# Patient Record
Sex: Female | Born: 1970 | Race: White | Hispanic: No | Marital: Married | Smoking: Former smoker
Health system: Southern US, Community
[De-identification: ages and names within clinical notes are randomized; demographics above are authoritative.]

## PROBLEM LIST (undated history)

## (undated) DIAGNOSIS — I8390 Asymptomatic varicose veins of unspecified lower extremity: Secondary | ICD-10-CM

## (undated) DIAGNOSIS — K219 Gastro-esophageal reflux disease without esophagitis: Secondary | ICD-10-CM

## (undated) HISTORY — DX: Asymptomatic varicose veins of unspecified lower extremity: I83.90

## (undated) HISTORY — PX: DILATION AND CURETTAGE OF UTERUS: SHX78

## (undated) HISTORY — PX: FOREIGN BODY REMOVAL: SHX962

## (undated) HISTORY — PX: BREAST ENHANCEMENT SURGERY: SHX7

---

## 2010-02-16 ENCOUNTER — Ambulatory Visit (HOSPITAL_COMMUNITY)
Admission: RE | Admit: 2010-02-16 | Discharge: 2010-02-16 | Payer: Self-pay | Source: Home / Self Care | Attending: Obstetrics & Gynecology | Admitting: Obstetrics & Gynecology

## 2010-02-28 LAB — SURGICAL PCR SCREEN
MRSA, PCR: NEGATIVE
Staphylococcus aureus: NEGATIVE

## 2010-02-28 LAB — CBC
HCT: 39.3 % (ref 36.0–46.0)
Hemoglobin: 13.3 g/dL (ref 12.0–15.0)
MCH: 30.9 pg (ref 26.0–34.0)
MCHC: 33.8 g/dL (ref 30.0–36.0)
MCV: 91.2 fL (ref 78.0–100.0)
Platelets: 135 10*3/uL — ABNORMAL LOW (ref 150–400)
RBC: 4.31 MIL/uL (ref 3.87–5.11)
RDW: 12.6 % (ref 11.5–15.5)
WBC: 9.6 10*3/uL (ref 4.0–10.5)

## 2010-02-28 LAB — RPR: RPR Ser Ql: NONREACTIVE

## 2010-03-02 ENCOUNTER — Encounter (INDEPENDENT_AMBULATORY_CARE_PROVIDER_SITE_OTHER): Payer: Self-pay | Admitting: Obstetrics & Gynecology

## 2010-03-02 ENCOUNTER — Inpatient Hospital Stay (HOSPITAL_COMMUNITY)
Admission: RE | Admit: 2010-03-02 | Discharge: 2010-03-04 | Payer: Self-pay | Source: Home / Self Care | Attending: Obstetrics & Gynecology | Admitting: Obstetrics & Gynecology

## 2010-03-06 LAB — CBC
HCT: 31.7 % — ABNORMAL LOW (ref 36.0–46.0)
Hemoglobin: 10.5 g/dL — ABNORMAL LOW (ref 12.0–15.0)
MCH: 30.1 pg (ref 26.0–34.0)
MCHC: 33.1 g/dL (ref 30.0–36.0)
MCV: 90.8 fL (ref 78.0–100.0)
Platelets: 110 10*3/uL — ABNORMAL LOW (ref 150–400)
RBC: 3.49 MIL/uL — ABNORMAL LOW (ref 3.87–5.11)
RDW: 12.6 % (ref 11.5–15.5)
WBC: 9.7 10*3/uL (ref 4.0–10.5)

## 2010-03-12 NOTE — Discharge Summary (Signed)
  NAMECAYLAH, [name]                 ACCOUNT NO.:  [id number]  MEDICAL RECORD NO.:  [id number]          PATIENT TYPE:  INP  LOCATION:  9105                          FACILITY:  [facility]  PHYSICIAN:  Darryl Nestle, MD     DATE OF BIRTH:  08/27/1970  DATE OF ADMISSION:  03/02/2010 DATE OF DISCHARGE:  03/04/2010                              DISCHARGE SUMMARY   ADMISSION DIAGNOSES: 1. A 38 weeks. 2. Oligohydramnios. 3. Lagging fetal growth. 4. Elective cesarean section.  DISCHARGE DIAGNOSES: 1. A 38 weeks. 2. Oligohydramnios. 3. Lagging fetal growth. 4. Elective cesarean section. 5. Status post surgery.  HOSPITAL COURSE:  The patient is a 40 year old G3, P1-0-1-1, who had oligohydramnios since 35 weeks and she was undergoing antenatal testing. This was an IVF ICSI pregnancy which had been uncomplicated thus far. The fetal growth was lagging.  The patient had MFM consult and the recommendation was to proceed with delivery if the largest amniotic fluid pocket was less than 2 cm.  On March 01, 2010, the ultrasound showed amniotic fluid index of 5.8, but the largest pocket was less than 2 cm.  The patient was hence recommended delivery on March 10, 2010, when she was 38 weeks.  The patient had prior vaginal delivery 16 years ago but she desired to have elective C-section for this pregnancy and refused vaginal birth.  Risks and complications of surgery were discussed with the patient including immediate and long-term complications and also possible blood transfusion and she voiced understanding.  Prenatal care was at [facility] with Dr. Juliene Pina.  Prenatal labs were within normal limits.  She was Rh positive, rubella immune, and GBS negative.  Rest of the labs were normal including normal 1-hour Glucola.  On March 02, 2010, the patient was admitted for elective cesarean section which was performed under spinal anesthesia and without any complications.  Postoperatively, the  patient continued to do well. Vital signs were stable.  Postoperative laboratory evaluations were within normal limits.  Her pain was well controlled.  She was ambulating, voiding, and tolerating general diet.  She desired to get discharged home on postop day #2.  Her baby girl stayed with the mother after delivery and was doing well.  For the details of surgery and baby's record, please refer to the operative note.  Incision was clean, dry, and intact.  She has subcuticular sutures with Steri-Strips.  Postop instructions were reviewed including warning signs and complications.  Followup will be in 6 weeks in the office with Dr. Juliene Pina and postoperative pain management was reviewed.  Prescriptions given for Percocet, ibuprofen, and Colace.  The patient will continue prenatal vitamins and additional calcium.  DISCHARGE DISPOSITION:  Home with family.  DISCHARGE CONDITION:  Stable.     Darryl Nestle, MD     VM/MEDQ  D:  03/09/2010  T:  03/09/2010  Job:  [id number]  Electronically Signed by Susa Day MODY  on 03/12/2010 06:28:31 PM

## 2010-03-12 NOTE — Op Note (Signed)
[name], [name]                 ACCOUNT NO.:  [id number]  MEDICAL RECORD NO.:  [id number]          PATIENT TYPE:  INP  LOCATION:  9105                          FACILITY:  [facility]  PHYSICIAN:  Darryl Nestle, MD     DATE OF BIRTH:  April 06, 1970  DATE OF PROCEDURE:  03/02/2010 DATE OF DISCHARGE:                              OPERATIVE REPORT   PREOPERATIVE DIAGNOSES: 1. 38 weeks' in vitro fertilization pregnancy. 2. Persistent oligohydramnios. 3. Lagging fetal growth. 4. Elective cesarean section desired.  POSTOPERATIVE DIAGNOSES: 1. 38 weeks' in vitro fertilization pregnancy. 2. Persistent oligohydramnios. 3. Lagging fetal growth. 4. Elective cesarean section desired.  PROCEDURE:  Primary low transverse cesarean section.  SURGEON:  Darryl Nestle, MD  ASSISTANT:  Marlinda Mike, CNM  ANESTHESIA:  Spinal.  FINDINGS:  Minimal amniotic fluid.  Baby girl delivered at 11:44 a.m. Apgars 8 and 9, weight 5 pounds 14 ounces.  Placenta normal three-vessel cord, normal ovaries.  SPECIMEN:  Placenta sent to pathology.  ESTIMATED BLOOD LOSS:  800 mL.  INTRAVENOUS FLUIDS:  2550 mL LR.  URINE OUTPUT:  100 mL, clear.  COMPLICATIONS:  None.  CONDITION:  Stable.  PATIENT DISPOSITION:  PACU.  INDICATIONS:  The patient is a 40 year old G3, P1-0-1-1 at 44 weeks, who conceived with IVF (ICSI).  She had uncomplicated pregnancy course until 35 weeks when oligohydramnios was diagnosed.  The patient's amniotic fluid fluctuated between 4.8 to 6 cm.  Thereafter, she had antenatal testing and ultrasound showed lagging fetal growth.  She had MFM consult and she was recommended delivery after 37 weeks if the largest amniotic fluid was less than 2 cm.  The patient had ultrasound performed on March 01, 2010, that showed amniotic fluid at 5.8, but the largest pocket was less than 2 cm.  The patient desired to proceed with cesarean delivery rather than vaginal trial of labor, even though her  prior delivery was vaginal, but that was about 16 years ago and she did not want to try vaginal delivery.  Risks and complications of surgery including infection, bleeding, and damage to internal organs, and delayed complications with adhesions and other complications were reviewed.  Informed written consent was obtained.  DESCRIPTION OF PROCEDURE:  The patient was brought to the operating room with IV running.  She had normal fetal heart tones, normal vital signs. She was given a spinal anesthesia and Ancef 1 g IV.  She was then made supine and was prepped and draped in normal sterile fashion after Foley catheter was placed.  Incision was made in Pfannenstiel fashion and carried down to the underlying fascia, which was incised in the midline, extended laterally curving upward.  Fascial edges were dissected off from underlying muscles which were then separated in the midline and the peritoneum was entered by a blunt and sharp dissection and extended superiorly and inferiorly.  There were no complications with the peritoneal entry.  Bladder blade was placed.  Lower segment was palpated.  The fetal head was floating.  The uterovesical fold of peritoneum was incised and extended laterally.  Bladder flap created. Bladder blade advanced.  A low transverse  cesarean incision was made, which was extended laterally curving upward with bandage scissors. Minimal amniotic fluid was noted.  There was no evidence of meconium. The head delivery was performed with gentle flexion and fundal pressure, and after head delivery, rest of the baby delivered without difficulty. The bulb suction was done of nose and mouth.  Cord was clamped and cut and baby was shown to the parents and given to the awaiting neonatologist.  Cord blood was collected.  Placenta was separated manually and removed.  Inside of the uterus was cleaned with clean lap x2.  The uterine incision edges were grasped with ring  forceps. Hysterotomy was closed in two layers using 0 Monocryl in two layers. The first layer was continuous interlocking, second one was imbricating the first.  Hemostasis was excellent.  Irrigation was performed of the peritoneum and the incision.  Bilateral ovaries and tubes appeared normal.  At this point, the parietal peritoneal edges were grasped with Kelly clamps and peritoneum was closed using 2-0 Vicryl.  Muscle at the level of pyramidalis was approximated using 2-0 Vicryl and the fascia was closed using 0 Vicryl starting from two angles meeting to the right of the patient's midline.  The subcutaneous tissue was evaluated.  There was no active bleeding and the skin was closed in subcuticular fashion using 3-0 Vicryl.  Steri-Strips applied.  Pressure dressing was given. The patient was brought out to the recovery room in stable condition. Baby was brought to the nursery while the mother was finishing up with the surgery and the patient's husband also went to the nursery.  Mother and baby are stable.     Darryl Nestle, MD     VM/MEDQ  D:  03/02/2010  T:  03/03/2010  Job:  [id number]  Electronically Signed by Susa Day Elyon Zoll  on 03/12/2010 81:82:99 PM

## 2010-03-14 ENCOUNTER — Inpatient Hospital Stay (HOSPITAL_COMMUNITY): Admission: AD | Admit: 2010-03-14 | Payer: Self-pay | Admitting: Obstetrics and Gynecology

## 2010-10-04 ENCOUNTER — Other Ambulatory Visit: Payer: Self-pay | Admitting: Dermatology

## 2011-07-25 ENCOUNTER — Other Ambulatory Visit: Payer: Self-pay

## 2011-12-06 LAB — OB RESULTS CONSOLE RUBELLA ANTIBODY, IGM: Rubella: IMMUNE

## 2011-12-06 LAB — OB RESULTS CONSOLE ABO/RH: RH Type: POSITIVE

## 2011-12-06 LAB — OB RESULTS CONSOLE ANTIBODY SCREEN: Antibody Screen: NEGATIVE

## 2011-12-06 LAB — OB RESULTS CONSOLE RPR: RPR: NONREACTIVE

## 2011-12-06 LAB — OB RESULTS CONSOLE HIV ANTIBODY (ROUTINE TESTING): HIV: NONREACTIVE

## 2011-12-06 LAB — OB RESULTS CONSOLE HEPATITIS B SURFACE ANTIGEN: Hepatitis B Surface Ag: NEGATIVE

## 2012-04-21 ENCOUNTER — Other Ambulatory Visit: Payer: Self-pay | Admitting: Obstetrics & Gynecology

## 2012-06-01 ENCOUNTER — Encounter (HOSPITAL_COMMUNITY): Payer: Self-pay | Admitting: Pharmacy Technician

## 2012-06-09 ENCOUNTER — Encounter (HOSPITAL_COMMUNITY): Payer: Self-pay

## 2012-06-10 ENCOUNTER — Encounter (HOSPITAL_COMMUNITY): Payer: Self-pay

## 2012-06-10 ENCOUNTER — Encounter (HOSPITAL_COMMUNITY)
Admission: RE | Admit: 2012-06-10 | Discharge: 2012-06-10 | Disposition: A | Discharge status: Home / Self Care | Payer: PRIVATE HEALTH INSURANCE | Source: Ambulatory Visit | Attending: Obstetrics & Gynecology | Admitting: Obstetrics & Gynecology

## 2012-06-10 HISTORY — DX: Gastro-esophageal reflux disease without esophagitis: K21.9

## 2012-06-10 LAB — CBC
HCT: 39.2 % (ref 36.0–46.0)
Hemoglobin: 13.3 g/dL (ref 12.0–15.0)
MCH: 30.6 pg (ref 26.0–34.0)
MCHC: 33.9 g/dL (ref 30.0–36.0)
MCV: 90.1 fL (ref 78.0–100.0)
Platelets: 130 10*3/uL — ABNORMAL LOW (ref 150–400)
RBC: 4.35 MIL/uL (ref 3.87–5.11)
RDW: 13.4 % (ref 11.5–15.5)
WBC: 11.3 10*3/uL — ABNORMAL HIGH (ref 4.0–10.5)

## 2012-06-10 LAB — RPR: RPR Ser Ql: NONREACTIVE

## 2012-06-10 LAB — TYPE AND SCREEN
ABO/RH(D): A POS
Antibody Screen: NEGATIVE

## 2012-06-10 LAB — ABO/RH: ABO/RH(D): A POS

## 2012-06-10 NOTE — Patient Instructions (Addendum)
20 [name]  06/10/2012   Your procedure is scheduled on:  06/12/12  Enter through the Main Entrance of [facility] at 6 AM.  Pick up the phone at the desk and dial [phone].   Call this number if you have problems the morning of surgery: [phone]   Remember:   Do not eat food:After Midnight.  Do not drink clear liquids: After Midnight.  Take these medicines the morning of surgery with A SIP OF WATER: Pepcid   Do not wear jewelry, make-up or nail polish.  Do not wear lotions, powders, or perfumes. You may wear deodorant.  Do not shave 48 hours prior to surgery.  Do not bring valuables to the hospital.  Contacts, dentures or bridgework may not be worn into surgery.  Leave suitcase in the car. After surgery it may be brought to your room.  For patients admitted to the hospital, checkout time is 11:00 AM the day of discharge.   Patients discharged the day of surgery will not be allowed to drive home.  Name and phone number of your driver: NA  Special Instructions: Shower using CHG 2 nights before surgery and the night before surgery.  If you shower the day of surgery use CHG.  Use special wash - you have one bottle of CHG for all showers.  You should use approximately 1/3 of the bottle for each shower.   Please read over the following fact sheets that you were given: Surgical Site Infection Prevention

## 2012-06-11 ENCOUNTER — Encounter (HOSPITAL_COMMUNITY): Payer: Self-pay | Admitting: Anesthesiology

## 2012-06-11 NOTE — Anesthesia Preprocedure Evaluation (Addendum)
Anesthesia Evaluation  Patient identified by MRN, date of birth, ID band Patient awake    Reviewed: Allergy & Precautions, H&P , NPO status , Patient's Chart, lab work & pertinent test results, Unable to perform ROS - Chart review only  Airway Mallampati: II TM Distance: >3 FB Neck ROM: Full    Dental no notable dental hx. (+) Teeth Intact   Pulmonary neg pulmonary ROS,    Pulmonary exam normal       Cardiovascular negative cardio ROS  Rhythm:Regular Rate:Normal     Neuro/Psych Depression negative neurological ROS     GI/Hepatic Neg liver ROS, GERD-  Medicated and Controlled,  Endo/Other  negative endocrine ROSObesity  Renal/GU negative Renal ROS  negative genitourinary   Musculoskeletal negative musculoskeletal ROS (+)   Abdominal   Peds  Hematology negative hematology ROS (+)   Anesthesia Other Findings   Reproductive/Obstetrics Twin Gestation Previous C/Section Desires Sterilization AMA                         Anesthesia Physical Anesthesia Plan  ASA: II  Anesthesia Plan: Spinal   Post-op Pain Management:    Induction:   Airway Management Planned: Natural Airway  Additional Equipment:   Intra-op Plan:   Post-operative Plan:   Informed Consent: I have reviewed the patients History and Physical, chart, labs and discussed the procedure including the risks, benefits and alternatives for the proposed anesthesia with the patient or authorized representative who has indicated his/her understanding and acceptance.   Dental advisory given  Plan Discussed with: Anesthesiologist, Surgeon and CRNA  Anesthesia Plan Comments:         Anesthesia Quick Evaluation

## 2012-06-12 ENCOUNTER — Inpatient Hospital Stay (HOSPITAL_COMMUNITY): Payer: PRIVATE HEALTH INSURANCE | Admitting: Anesthesiology

## 2012-06-12 ENCOUNTER — Encounter (HOSPITAL_COMMUNITY)
Admission: RE | Disposition: A | Discharge status: Home / Self Care | Payer: Self-pay | Source: Ambulatory Visit | Attending: Obstetrics & Gynecology

## 2012-06-12 ENCOUNTER — Inpatient Hospital Stay (HOSPITAL_COMMUNITY)
Admission: RE | Admit: 2012-06-12 | Discharge: 2012-06-15 | DRG: 765 | Disposition: A | Discharge status: Home / Self Care | Payer: PRIVATE HEALTH INSURANCE | Source: Ambulatory Visit | Attending: Obstetrics & Gynecology | Admitting: Obstetrics & Gynecology

## 2012-06-12 ENCOUNTER — Encounter (HOSPITAL_COMMUNITY): Payer: Self-pay | Admitting: Anesthesiology

## 2012-06-12 ENCOUNTER — Encounter (HOSPITAL_COMMUNITY): Payer: Self-pay | Admitting: Registered Nurse

## 2012-06-12 DIAGNOSIS — D696 Thrombocytopenia, unspecified: Secondary | ICD-10-CM | POA: Diagnosis not present

## 2012-06-12 DIAGNOSIS — O30049 Twin pregnancy, dichorionic/diamniotic, unspecified trimester: Secondary | ICD-10-CM

## 2012-06-12 DIAGNOSIS — D689 Coagulation defect, unspecified: Secondary | ICD-10-CM | POA: Diagnosis not present

## 2012-06-12 DIAGNOSIS — O34219 Maternal care for unspecified type scar from previous cesarean delivery: Principal | ICD-10-CM | POA: Diagnosis present

## 2012-06-12 DIAGNOSIS — F329 Major depressive disorder, single episode, unspecified: Secondary | ICD-10-CM | POA: Diagnosis present

## 2012-06-12 DIAGNOSIS — O09529 Supervision of elderly multigravida, unspecified trimester: Secondary | ICD-10-CM | POA: Diagnosis present

## 2012-06-12 DIAGNOSIS — F3289 Other specified depressive episodes: Secondary | ICD-10-CM | POA: Diagnosis present

## 2012-06-12 DIAGNOSIS — O30009 Twin pregnancy, unspecified number of placenta and unspecified number of amniotic sacs, unspecified trimester: Secondary | ICD-10-CM | POA: Diagnosis not present

## 2012-06-12 DIAGNOSIS — O3090 Multiple gestation, unspecified, unspecified trimester: Secondary | ICD-10-CM | POA: Diagnosis present

## 2012-06-12 DIAGNOSIS — Z302 Encounter for sterilization: Secondary | ICD-10-CM

## 2012-06-12 DIAGNOSIS — O9913 Other diseases of the blood and blood-forming organs and certain disorders involving the immune mechanism complicating the puerperium: Secondary | ICD-10-CM | POA: Diagnosis not present

## 2012-06-12 DIAGNOSIS — O99344 Other mental disorders complicating childbirth: Secondary | ICD-10-CM | POA: Diagnosis present

## 2012-06-12 HISTORY — PX: BILATERAL SALPINGECTOMY: SHX5743

## 2012-06-12 SURGERY — Surgical Case
Anesthesia: Spinal | Site: Abdomen | Wound class: Clean Contaminated

## 2012-06-12 MED ORDER — WITCH HAZEL-GLYCERIN EX PADS: 1.0000 "application " | MEDICATED_PAD | CUTANEOUS | Status: DC | PRN

## 2012-06-12 MED ORDER — SENNOSIDES-DOCUSATE SODIUM 8.6-50 MG PO TABS
2.0000 | ORAL_TABLET | Freq: Every day | ORAL | Status: DC
  Administered 2012-06-12 – 2012-06-14 (×3): 2 via ORAL

## 2012-06-12 MED ORDER — KETOROLAC TROMETHAMINE 30 MG/ML IJ SOLN
INTRAMUSCULAR | Status: AC
  Administered 2012-06-12: 30 mg via INTRAVENOUS
  Filled 2012-06-12: qty 1

## 2012-06-12 MED ORDER — DIPHENHYDRAMINE HCL 25 MG PO CAPS: 25.0000 mg | ORAL_CAPSULE | ORAL | Status: DC | PRN

## 2012-06-12 MED ORDER — ONDANSETRON HCL 4 MG/2ML IJ SOLN: 4.0000 mg | INTRAMUSCULAR | Status: DC | PRN

## 2012-06-12 MED ORDER — SERTRALINE HCL 50 MG PO TABS
50.0000 mg | ORAL_TABLET | Freq: Every day | ORAL | Status: DC
  Administered 2012-06-12 – 2012-06-15 (×4): 50 mg via ORAL
  Filled 2012-06-12 (×4): qty 1

## 2012-06-12 MED ORDER — CEFAZOLIN SODIUM-DEXTROSE 2-3 GM-% IV SOLR
2.0000 g | INTRAVENOUS | Status: AC
  Administered 2012-06-12: 2 g via INTRAVENOUS

## 2012-06-12 MED ORDER — ONDANSETRON HCL 4 MG PO TABS: 4.0000 mg | ORAL_TABLET | ORAL | Status: DC | PRN

## 2012-06-12 MED ORDER — OXYTOCIN 10 UNIT/ML IJ SOLN
40.0000 [IU] | INTRAVENOUS | Status: DC | PRN
  Administered 2012-06-12: 40 [IU] via INTRAVENOUS

## 2012-06-12 MED ORDER — LANOLIN HYDROUS EX OINT: 1.0000 "application " | TOPICAL_OINTMENT | CUTANEOUS | Status: DC | PRN

## 2012-06-12 MED ORDER — NALOXONE HCL 0.4 MG/ML IJ SOLN: 0.4000 mg | INTRAMUSCULAR | Status: DC | PRN

## 2012-06-12 MED ORDER — GLYCOPYRROLATE 0.2 MG/ML IJ SOLN
INTRAMUSCULAR | Status: DC | PRN
  Administered 2012-06-12: 0.2 mg via INTRAVENOUS

## 2012-06-12 MED ORDER — SCOPOLAMINE 1 MG/3DAYS TD PT72: 1.0000 | MEDICATED_PATCH | Freq: Once | TRANSDERMAL | Status: DC

## 2012-06-12 MED ORDER — BUPIVACAINE IN DEXTROSE 0.75-8.25 % IT SOLN
INTRATHECAL | Status: DC | PRN
  Administered 2012-06-12: 1.3 mg via INTRATHECAL

## 2012-06-12 MED ORDER — PRENATAL MULTIVITAMIN CH
1.0000 | ORAL_TABLET | Freq: Every day | ORAL | Status: DC
  Administered 2012-06-13 – 2012-06-15 (×3): 1 via ORAL
  Filled 2012-06-12 (×3): qty 1

## 2012-06-12 MED ORDER — EPHEDRINE SULFATE 50 MG/ML IJ SOLN
INTRAMUSCULAR | Status: DC | PRN
  Administered 2012-06-12 (×6): 10 mg via INTRAVENOUS

## 2012-06-12 MED ORDER — LACTATED RINGERS IV SOLN
INTRAVENOUS | Status: DC
  Administered 2012-06-12 (×3): via INTRAVENOUS

## 2012-06-12 MED ORDER — OXYTOCIN 10 UNIT/ML IJ SOLN
INTRAMUSCULAR | Status: AC
  Filled 2012-06-12: qty 4

## 2012-06-12 MED ORDER — KETOROLAC TROMETHAMINE 30 MG/ML IJ SOLN
30.0000 mg | Freq: Four times a day (QID) | INTRAMUSCULAR | Status: AC | PRN
  Administered 2012-06-12: 30 mg via INTRAVENOUS
  Filled 2012-06-12: qty 1

## 2012-06-12 MED ORDER — PHENYLEPHRINE 40 MCG/ML (10ML) SYRINGE FOR IV PUSH (FOR BLOOD PRESSURE SUPPORT)
PREFILLED_SYRINGE | INTRAVENOUS | Status: AC
  Filled 2012-06-12: qty 5

## 2012-06-12 MED ORDER — OXYCODONE-ACETAMINOPHEN 5-325 MG PO TABS
1.0000 | ORAL_TABLET | ORAL | Status: DC | PRN
  Administered 2012-06-13 – 2012-06-14 (×5): 1 via ORAL
  Filled 2012-06-12 (×5): qty 1

## 2012-06-12 MED ORDER — CEFAZOLIN SODIUM-DEXTROSE 2-3 GM-% IV SOLR
INTRAVENOUS | Status: AC
  Filled 2012-06-12: qty 50

## 2012-06-12 MED ORDER — NALBUPHINE HCL 10 MG/ML IJ SOLN
5.0000 mg | INTRAMUSCULAR | Status: DC | PRN
  Filled 2012-06-12: qty 1

## 2012-06-12 MED ORDER — ZOLPIDEM TARTRATE 5 MG PO TABS: 5.0000 mg | ORAL_TABLET | Freq: Every evening | ORAL | Status: DC | PRN

## 2012-06-12 MED ORDER — FENTANYL CITRATE 0.05 MG/ML IJ SOLN: 25.0000 ug | INTRAMUSCULAR | Status: DC | PRN

## 2012-06-12 MED ORDER — FENTANYL CITRATE 0.05 MG/ML IJ SOLN
INTRAMUSCULAR | Status: AC
  Filled 2012-06-12: qty 2

## 2012-06-12 MED ORDER — SIMETHICONE 80 MG PO CHEW
80.0000 mg | CHEWABLE_TABLET | Freq: Three times a day (TID) | ORAL | Status: DC
  Administered 2012-06-12 – 2012-06-15 (×9): 80 mg via ORAL

## 2012-06-12 MED ORDER — MORPHINE SULFATE (PF) 0.5 MG/ML IJ SOLN
INTRAMUSCULAR | Status: DC | PRN
  Administered 2012-06-12: .2 mg via INTRATHECAL

## 2012-06-12 MED ORDER — OXYTOCIN 40 UNITS IN LACTATED RINGERS INFUSION - SIMPLE MED: 62.5000 mL/h | INTRAVENOUS | Status: AC

## 2012-06-12 MED ORDER — ONDANSETRON HCL 4 MG/2ML IJ SOLN: 4.0000 mg | Freq: Three times a day (TID) | INTRAMUSCULAR | Status: DC | PRN

## 2012-06-12 MED ORDER — SODIUM CHLORIDE 0.9 % IR SOLN
Status: DC | PRN
  Administered 2012-06-12: 1000 mL

## 2012-06-12 MED ORDER — ONDANSETRON HCL 4 MG/2ML IJ SOLN
INTRAMUSCULAR | Status: DC | PRN
  Administered 2012-06-12: 4 mg via INTRAVENOUS

## 2012-06-12 MED ORDER — DIPHENHYDRAMINE HCL 50 MG/ML IJ SOLN: 12.5000 mg | INTRAMUSCULAR | Status: DC | PRN

## 2012-06-12 MED ORDER — MORPHINE SULFATE 0.5 MG/ML IJ SOLN
INTRAMUSCULAR | Status: AC
  Filled 2012-06-12: qty 10

## 2012-06-12 MED ORDER — DIBUCAINE 1 % RE OINT: 1.0000 "application " | TOPICAL_OINTMENT | RECTAL | Status: DC | PRN

## 2012-06-12 MED ORDER — LACTATED RINGERS IV SOLN
INTRAVENOUS | Status: DC
  Administered 2012-06-12 (×2): via INTRAVENOUS

## 2012-06-12 MED ORDER — IBUPROFEN 600 MG PO TABS
600.0000 mg | ORAL_TABLET | Freq: Four times a day (QID) | ORAL | Status: DC
  Administered 2012-06-12 – 2012-06-15 (×10): 600 mg via ORAL
  Filled 2012-06-12 (×11): qty 1

## 2012-06-12 MED ORDER — EPHEDRINE 5 MG/ML INJ
INTRAVENOUS | Status: AC
  Filled 2012-06-12: qty 10

## 2012-06-12 MED ORDER — METOCLOPRAMIDE HCL 5 MG/ML IJ SOLN: 10.0000 mg | Freq: Three times a day (TID) | INTRAMUSCULAR | Status: DC | PRN

## 2012-06-12 MED ORDER — MENTHOL 3 MG MT LOZG: 1.0000 | LOZENGE | OROMUCOSAL | Status: DC | PRN

## 2012-06-12 MED ORDER — MEPERIDINE HCL 25 MG/ML IJ SOLN: 6.2500 mg | INTRAMUSCULAR | Status: DC | PRN

## 2012-06-12 MED ORDER — NALOXONE HCL 1 MG/ML IJ SOLN
1.0000 ug/kg/h | INTRAVENOUS | Status: DC | PRN
  Filled 2012-06-12: qty 2

## 2012-06-12 MED ORDER — KETOROLAC TROMETHAMINE 30 MG/ML IJ SOLN: 30.0000 mg | Freq: Four times a day (QID) | INTRAMUSCULAR | Status: AC | PRN

## 2012-06-12 MED ORDER — SIMETHICONE 80 MG PO CHEW: 80.0000 mg | CHEWABLE_TABLET | ORAL | Status: DC | PRN

## 2012-06-12 MED ORDER — SCOPOLAMINE 1 MG/3DAYS TD PT72
MEDICATED_PATCH | TRANSDERMAL | Status: AC
  Administered 2012-06-12: 1.5 mg via TRANSDERMAL
  Filled 2012-06-12: qty 1

## 2012-06-12 MED ORDER — SODIUM CHLORIDE 0.9 % IJ SOLN: 3.0000 mL | INTRAMUSCULAR | Status: DC | PRN

## 2012-06-12 MED ORDER — FAMOTIDINE 20 MG PO TABS
20.0000 mg | ORAL_TABLET | Freq: Two times a day (BID) | ORAL | Status: DC
  Administered 2012-06-12 – 2012-06-13 (×3): 20 mg via ORAL
  Filled 2012-06-12 (×6): qty 1

## 2012-06-12 MED ORDER — PHENYLEPHRINE HCL 10 MG/ML IJ SOLN
INTRAMUSCULAR | Status: DC | PRN
  Administered 2012-06-12: 40 ug via INTRAVENOUS
  Administered 2012-06-12 (×5): 80 ug via INTRAVENOUS

## 2012-06-12 MED ORDER — DIPHENHYDRAMINE HCL 25 MG PO CAPS: 25.0000 mg | ORAL_CAPSULE | Freq: Four times a day (QID) | ORAL | Status: DC | PRN

## 2012-06-12 MED ORDER — GLYCOPYRROLATE 0.2 MG/ML IJ SOLN
INTRAMUSCULAR | Status: AC
  Filled 2012-06-12: qty 1

## 2012-06-12 MED ORDER — PHENYLEPHRINE 40 MCG/ML (10ML) SYRINGE FOR IV PUSH (FOR BLOOD PRESSURE SUPPORT)
PREFILLED_SYRINGE | INTRAVENOUS | Status: AC
  Filled 2012-06-12: qty 10

## 2012-06-12 MED ORDER — ONDANSETRON HCL 4 MG/2ML IJ SOLN
INTRAMUSCULAR | Status: AC
  Filled 2012-06-12: qty 2

## 2012-06-12 MED ORDER — FENTANYL CITRATE 0.05 MG/ML IJ SOLN
INTRAMUSCULAR | Status: DC | PRN
  Administered 2012-06-12: 12.5 ug via INTRATHECAL

## 2012-06-12 MED ORDER — DIPHENHYDRAMINE HCL 50 MG/ML IJ SOLN: 25.0000 mg | INTRAMUSCULAR | Status: DC | PRN

## 2012-06-12 SURGICAL SUPPLY — 39 items
BENZOIN TINCTURE PRP APPL 2/3 (GAUZE/BANDAGES/DRESSINGS) ×4 IMPLANT
CLOTH BEACON ORANGE TIMEOUT ST (SAFETY) ×4 IMPLANT
CONTAINER PREFILL 10% NBF 15ML (MISCELLANEOUS) ×8 IMPLANT
DRAPE LG THREE QUARTER DISP (DRAPES) ×4 IMPLANT
DRSG OPSITE POSTOP 4X10 (GAUZE/BANDAGES/DRESSINGS) ×4 IMPLANT
DURAPREP 26ML APPLICATOR (WOUND CARE) ×4 IMPLANT
ELECT REM PT RETURN 9FT ADLT (ELECTROSURGICAL) ×4
ELECTRODE REM PT RTRN 9FT ADLT (ELECTROSURGICAL) ×3 IMPLANT
EXTRACTOR VACUUM KIWI (MISCELLANEOUS) IMPLANT
EXTRACTOR VACUUM M CUP 4 TUBE (SUCTIONS) IMPLANT
GAUZE SPONGE 4X4 12PLY STRL LF (GAUZE/BANDAGES/DRESSINGS) ×4 IMPLANT
GLOVE BIO SURGEON STRL SZ7 (GLOVE) ×4 IMPLANT
GLOVE BIOGEL PI IND STRL 7.0 (GLOVE) ×3 IMPLANT
GLOVE BIOGEL PI INDICATOR 7.0 (GLOVE) ×1
GOWN STRL REIN XL XLG (GOWN DISPOSABLE) ×8 IMPLANT
KIT ABG SYR 3ML LUER SLIP (SYRINGE) IMPLANT
NEEDLE HYPO 25X5/8 SAFETYGLIDE (NEEDLE) IMPLANT
NS IRRIG 1000ML POUR BTL (IV SOLUTION) ×4 IMPLANT
PACK C SECTION WH (CUSTOM PROCEDURE TRAY) ×4 IMPLANT
PAD ABD 7.5X8 STRL (GAUZE/BANDAGES/DRESSINGS) ×4 IMPLANT
PAD OB MATERNITY 4.3X12.25 (PERSONAL CARE ITEMS) ×4 IMPLANT
RTRCTR C-SECT PINK 25CM LRG (MISCELLANEOUS) IMPLANT
STAPLER VISISTAT 35W (STAPLE) IMPLANT
STRIP CLOSURE SKIN 1/4X4 (GAUZE/BANDAGES/DRESSINGS) ×4 IMPLANT
SUT MNCRL 0 VIOLET CTX 36 (SUTURE) ×9 IMPLANT
SUT MONOCRYL 0 CTX 36 (SUTURE) ×3
SUT PLAIN 0 NONE (SUTURE) IMPLANT
SUT PLAIN 2 0 (SUTURE)
SUT PLAIN ABS 2-0 CT1 27XMFL (SUTURE) IMPLANT
SUT VIC AB 0 CT1 27 (SUTURE) ×2
SUT VIC AB 0 CT1 27XBRD ANBCTR (SUTURE) ×6 IMPLANT
SUT VIC AB 2-0 CT1 27 (SUTURE) ×2
SUT VIC AB 2-0 CT1 TAPERPNT 27 (SUTURE) ×6 IMPLANT
SUT VIC AB 4-0 KS 27 (SUTURE) ×4 IMPLANT
SUT VICRYL 0 TIES 12 18 (SUTURE) IMPLANT
TAPE CLOTH SURG 4X10 WHT LF (GAUZE/BANDAGES/DRESSINGS) ×4 IMPLANT
TOWEL OR 17X24 6PK STRL BLUE (TOWEL DISPOSABLE) ×12 IMPLANT
TRAY FOLEY CATH 14FR (SET/KITS/TRAYS/PACK) ×4 IMPLANT
WATER STERILE IRR 1000ML POUR (IV SOLUTION) IMPLANT

## 2012-06-12 NOTE — H&P (Signed)
[name] is a 42 y.o. female presenting for repeat cesarean section and tubal ligation at 37 wks for Di-Di twins.  [id number] at 37 wks. IVF pregnancy. Ob course significant for AMA, poor appetite with low weight gain, depression needing Zoloft 100mg  that was tapered to 50 mg in last 3 wks.  Denies contractions/ bleeding. Antenatal testing andd interval growth sono on twins noted concordant growth, BPP 8/8.  ,History OB History   Grav Para Term Preterm Abortions TAB SAB Ect Mult Living   2        1 2      Past Medical History  Diagnosis Date  . GERD (gastroesophageal reflux disease)    Past Surgical History  Procedure Laterality Date  . Cesarean section    . Breast enhancement surgery    . Dilation and curettage of uterus    . Foreign body removal      foot   Family History: family history is not on file. Social History:  has no tobacco, alcohol, and drug history on file.   Prenatal Transfer Tool  Maternal Diabetes: No Genetic Screening: Normal Maternal Ultrasounds/Referrals: Normal Fetal Ultrasounds or other Referrals:  None Maternal Substance Abuse:  No Significant Maternal Medications:  Meds include: Zoloft, TDaP Significant Maternal Lab Results:  Lab values include: Group B Strep negative Other Comments:  IVF-ICSI pregnancies   Di-Di twins, A- BOY, B- GiRL, she has unilat choroid plexus cyst, no other markers. ROS Depression, coping well   Blood pressure 109/69, pulse 74, temperature 98.4 F (36.9 C), temperature source Oral, resp. rate 18, SpO2 99.00%. Exam Physical Exam  :  A&O x 3, no acute distress. Pleasant HEENT neg, no thyromegaly Lungs CTA bilat CV RRR, S1S2 normal Abdo soft, non tender, non acute, gravid Extr no edema/ tenderness Pelvic deferred FHT 140s. Breech/ Breech Toco none palpated  Prenatal labs: ABO, Rh: --/--/A POS, A POS (04/30 1055) Antibody: NEG (04/30 1055) Rubella: Immune (10/25 0000) RPR: NON REACTIVE (04/30 1055)  HBsAg:  Negative (10/25 0000)  HIV: Non-reactive (10/25 0000)  GBS:   negative Ultrascreen and AFP1 neg Glucola nl Anatomy sono - Di-Di twins, A- BOY, B- GiRL, she has unilat choroid plexus cyst, no other markers.  Assessment/Plan: 42 yo AMA, Di-Di IVF twins, prior c/s. Here for c/s and tubal ligation.  Risks/complications of surgery reviewed incl infection, bleeding, damage to internal organs including bladder, bowels, ureters, blood vessels, other risks from anesthesia, VTE and delayed complications of any surgery, complications in future surgery reviewed. Also discussed neonatal complications incl difficult delivery, laceration, vacuum assistance, TTN etc. Pt understands and agrees, all concerns addressed.   TL reviewed, incl permanent, with failure and risk of ectopic pregnancy risk.   Milton Streicher R 06/12/2012, 6:40 AM

## 2012-06-12 NOTE — Op Note (Signed)
Cesarean Section Procedure Note [name], 06/12/2012  Indications: Prior cesarean section, IVF Di-Di twins at 37 wks, both Breech. Permanent sterility desired                   Pre-operative Diagnosis: Twins, Previous Cesarean Section, Breech/ Breech, Desires sterilization  [phone], [phone].   Post-operative Diagnosis: Same   Procedure Note: Repeat cesarean section and bilateral Salpingectomy  Surgeon: Robley Fries, MD    Assistants: Marlinda Mike, CNM  Anesthesia: Spinal   Procedure Details:  The patient was seen in the Holding Room. The risks, benefits, complications, treatment options, and expected outcomes were discussed with the patient. The patient concurred with the proposed plan, giving informed consent. identified as [name] and the procedure verified as C-Section Delivery and tubal ligation. A Time Out was held and the above information confirmed.  After induction of Spinal anesthesia, the patient was draped and prepped in the usual sterile manner and foley catheter placed. 2 gm Ancef given.  A Pfannenstiel Incision was made at prior cesarean scar and carried down through the subcutaneous tissue to the fascia. Fascial incision was made and extended transversely. The fascia was separated from the underlying rectus tissue superiorly and inferiorly. The peritoneum was identified and entered. Peritoneal incision was extended longitudinally. There were no adhesions. The utero-vesical peritoneal reflection was incised transversely and the bladder flap was bluntly freed from the lower uterine segment. Lower segment was thin. A low transverse uterine incision was made. Baby A's amniotomy performed, clear fluid, baby was in Complete Breech position. Delivered from Breech position by Complete breech extraction was baby BOY at  with Apgar scores of 8 at one minute and 9 at five minutes. Cord clamped and cut, infant mouth bulb suctioned and baby was handed to NICU team. Baby B's amniotomy  performed after grasping one foot from Footling Breech position, clear fluid noted. Baby GIRL was delivered by complete Breech extraction. Nuchal cord x 3 noted, reduced after head delivery and cord clamped and cut and baby was handed to NICU team. Apgars 9 and 9 at 1 minute and 5 minutes. Cord ph was not sent but cord blood was obtained for evaluation. The placenta x2 were removed Intact and appeared normal, sent to pathology. The uterine outline, tubes and ovaries appeared normal. The uterine incision was closed with running locked sutures of 0 Monocryl with a second imbricating layer. Hemostasis was observed.  Both the fallopian tubes appeared normal and relatively avascular so decision made to perform bilateral salpingectomy to reduce ovarian cancer risk. Procedure performed with cauterizing mesosalpinx and cutting with excellent hemostasis, cut tubes sent to pathology.  Peritoneum grasped, sutured with 2-0 Vicryl. Muscles approximated in lower half. The fascia was then reapproximated with running sutures of 0Vicryl. The subcuticular closure was performed using 4-0Vicryl.. Steristrips and sterile dressing placed.   Instrument, sponge, and needle counts were correct prior the abdominal closure and were correct at the conclusion of the case.   Findings: Baby A, BOY, complete breech, delivered at 7.59 am, Apgars 8 and 9 at and 5 min.                   Baby B GIRL, footling breech, delivered at 8 am, Apgars 9 and 9 at and 5 min. Nuchal cord x 3                  Planceta x 2, normal. 3 vessel cords.  Both tubes and ovaries normal.    Estimated Blood Loss: 800 cc   Total IV Fluids: 2500  ml LR  Urine Output: 200CC OF clear urine  Specimens: Placentas, both fallopian tubes   Complications: no complications  Disposition: PACU - hemodynamically stable.   Maternal Condition: stable   Baby condition / location:  Boy in Nursery for observation and Girl with mother  stable.  Attending Attestation: I performed the procedure.   Signed: Surgeon(s): Robley Fries, MD

## 2012-06-12 NOTE — Anesthesia Postprocedure Evaluation (Signed)
  Anesthesia Post-op Note  Patient: [name]  Procedure(s) Performed: Procedure(s): CESAREAN SECTION (N/A) BILATERAL SALPINGECTOMY (Bilateral)  Patient Location: Mother/Baby  Anesthesia Type:Spinal  Level of Consciousness: awake  Airway and Oxygen Therapy: Patient Spontanous Breathing  Post-op Pain: mild  Post-op Assessment: Patient's Cardiovascular Status Stable and Respiratory Function Stable  Post-op Vital Signs: stable  Complications: No apparent anesthesia complications

## 2012-06-12 NOTE — Preoperative (Signed)
Beta Blockers   Reason not to administer Beta Blockers:Not Applicable 

## 2012-06-12 NOTE — Transfer of Care (Signed)
Immediate Anesthesia Transfer of Care Note  Patient: [name]  Procedure(s) Performed: Procedure(s): CESAREAN SECTION (N/A) BILATERAL SALPINGECTOMY (Bilateral)  Patient Location: PACU  Anesthesia Type:Spinal  Level of Consciousness: awake, alert  and oriented  Airway & Oxygen Therapy: Patient Spontanous Breathing  Post-op Assessment: Report given to PACU RN  Post vital signs: Reviewed  Complications: No apparent anesthesia complications

## 2012-06-12 NOTE — Anesthesia Postprocedure Evaluation (Signed)
Anesthesia Post Note  Patient: [name]  Procedure(s) Performed: Procedure(s) (LRB): CESAREAN SECTION (N/A) BILATERAL SALPINGECTOMY (Bilateral)  Anesthesia type: Spinal  Patient location: PACU  Post pain: Pain level controlled  Post assessment: Post-op Vital signs reviewed  Last Vitals:  Filed Vitals:   06/12/12 0915  BP: 104/62  Pulse: 62  Temp: 36.7 C  Resp: 18    Post vital signs: Reviewed  Level of consciousness: awake  Complications: No apparent anesthesia complications

## 2012-06-12 NOTE — Consult Note (Signed)
Neonatology Note:   Attendance at C-section:    I was asked by Dr. Juliene Pina to attend this primary C/S at 37 0/7 weeks due to twin gestation with breech presentation. The mother is a G5P2A2 A pos, GBS neg with di-di concordant twins, BPP 8/8. ROM at delivery, fluid clear. History of depression, on Zoloft.  Twin A, a female, was delivered breech and was vigorous with good spontaneous cry and tone. Needed  bulb suctioning. At 3 min of life, he continued to be cyanotic, so a pulse oximeter was placed, showing O2 saturation of 35%. We gave BBO2 with improvement in O2 saturations to normal; we did DeLee suctioning and got 10 ml of clear fluid out; then we weaned the FIO2 slowly, but he did not tolerate room air at 15 min of life. He had minimal subcostal retractions and his lungs sounded clear. I spoke with his parents and elected to move him to the central nursery for transition. Ap 8/9.  To CN to care of Pediatrician.  Twin B, a female, delivered breech and had a CAN times 3. She was vigorous with good tone and had Ap 9/9. Lungs clear to ausc., allowed to have skin to skin time with mother. Transferred care to the Pediatrician.   Doretha Sou, MD

## 2012-06-12 NOTE — Anesthesia Procedure Notes (Signed)
Spinal  Patient location during procedure: OR Start time: 06/12/2012 7:27 AM End time: 06/12/2012 7:31 AM Staffing Anesthesiologist: Sandrea Hughs Performed by: anesthesiologist  Preanesthetic Checklist Completed: patient identified, site marked, surgical consent, pre-op evaluation, timeout performed, IV checked, risks and benefits discussed and monitors and equipment checked Spinal Block Patient position: sitting Prep: DuraPrep Patient monitoring: heart rate, cardiac monitor, continuous pulse ox and blood pressure Approach: midline Location: L3-4 Injection technique: single-shot Needle Needle type: Sprotte  Needle gauge: 24 G Needle length: 9 cm Needle insertion depth: 5 cm Assessment Sensory level: T4

## 2012-06-13 LAB — CBC
Hemoglobin: 11.5 g/dL — ABNORMAL LOW (ref 12.0–15.0)
MCH: 30.6 pg (ref 26.0–34.0)
MCHC: 33.4 g/dL (ref 30.0–36.0)
MCV: 91.5 fL (ref 78.0–100.0)

## 2012-06-13 NOTE — Progress Notes (Signed)
POSTOPERATIVE DAY # 1 S/P cesarean section  S:         Reports feeling exhausted - abdomen sore             Tolerating po intake / no nausea / no vomiting / no flatus yet / no BM             Bleeding is light             Pain controlled with motrin and percocet             Up ad lib / ambulatory/ voiding QS  Newborn twins - Boy in NICU / Girl rooming-in and breast-feeding well / Circumcision planned  O:  VS: BP 93/55  Pulse 58  Temp(Src) 98 F (36.7 C) (Oral)  Resp 16  Ht 5\' 4"  (1.626 m)  Wt 81.194 kg (179 lb)  BMI 30.71 kg/m2  SpO2 97%   LABS:  Recent Labs  06/10/12 1055 06/13/12 0650  WBC 11.3* 9.6  HGB 13.3 11.5*  PLT 130* PENDING                           I&O: Intake/Output     05/02 0701 - 05/03 0700 05/03 0701 - 05/04 0700   P.O. 840    I.V. (mL/kg) 2895 (35.7)    Total Intake(mL/kg) 3735 (46)    Urine (mL/kg/hr) 2950 (1.5)    Blood 800 (0.4)    Total Output 3750     Net -15                     Physical Exam:             Alert and Oriented X3  Lungs: Clear and unlabored  Heart: regular rate and rhythm / no mumurs  Abdomen: soft, non-tender, non-distended, hypoactive BS             Fundus: firm, non-tender, U-1             Dressing intact honeycomb with pressure dressing on top              Perineum: no edema  Lochia: light  Extremities: trace edema, no calf pain or tenderness, SCD in use  A:        POD # 1 S/P Cesarean section and tubal sterilization            Mild thrombocytopenia - platelet count pending this am  P:        Routine postoperative care              Advance activity             Check platelet count when available               Marlinda Mike CNM, MSN 06/13/2012, 9:08 AM

## 2012-06-14 NOTE — Clinical Social Work Note (Signed)
Clinical Social Work Department PSYCHOSOCIAL ASSESSMENT - MATERNAL/CHILD 06/14/2012  Patient:  [name],[name]  Account Number:  [id number]  Admit Date:  06/12/2012  Childs Name:   [name]    Clinical Social Worker:  Verenise Moulin, LCSW   Date/Time:  06/14/2012 01:00 PM  Date Referred:  06/14/2012   Referral source  Physician     Referred reason  NICU   Other referral source:    I:  FAMILY / HOME ENVIRONMENT Child's legal guardian:  PARENT  Guardian - Name Guardian - Age Guardian - Address  Sheniqua Jeffries [address] [place], [place] [address]  Brian Purdum  [address] [place], [place] [address]   Other household support members/support persons Name Relationship DOB  16 yo BROTHER   2 yo SISTER    Other support:   MOB reports good family support.    II  PSYCHOSOCIAL DATA Information Source:  Patient Interview  Financial and Community Resources Employment:   MOB unemployed  FOB is a physician   Financial resources:  Private Insurance If Medicaid - County:    School / Grade:   Maternity Care Coordinator / Child Services Coordination / Early Interventions:  Cultural issues impacting care:    III  STRENGTHS Strengths  Adequate Resources  Home prepared for Child (including basic supplies)  Supportive family/friends  Compliance with medical plan  Understanding of illness   Strength comment:    IV  RISK FACTORS AND CURRENT PROBLEMS Current Problem:  None   Risk Factor & Current Problem Patient Issue Family Issue Risk Factor / Current Problem Comment   N N     V  SOCIAL WORK ASSESSMENT CSW spoke with MOB in room at bedside.  CSW discussed infant admission to NICU and understanding of illness.  MOB reports understanding and good communication with nurses and doctors.  MOB reports appropriate emotion response to NICU admission. CSW discussed PPD symptoms and MOB reported she knew what to look out for. MOB is currenlty on Zoloft to manage depression. CSW instructed  MOB to let RN or CSW know if any concerns arise.   MOB and FOB are married and live with two other children in the home. CSW discussed any concerns with supplies or family support.  MOB reports no concerns with supplies at this time.  CSW discussed continued NICU stay and SW support.  CSW instructed MOB and FOB to let CSW know if any concerns arise.  CSW will continue to follow while infant is in NICU.      VI SOCIAL WORK PLAN Social Work Plan  Psychosocial Support/Ongoing Assessment of Needs   Type of pt/family education:   If child protective services report - county:   If child protective services report - date:   Information/referral to community resources comment:   Other social work plan:    

## 2012-06-14 NOTE — Progress Notes (Signed)
POSTOPERATIVE DAY # 2 S/P cesarean section - twins with tubal sterilization (bilateral salpingectomy)  S:         Reports feeling upset - tired                          concerned for son in NICU - started on Cpap and ABX this am - concern for pneumonia             Tolerating po intake / no nausea / no  vomiting / + flatus / no BM             Bleeding is light             Pain controlled with motrin and percocet  - more pain today / entire lower abdomen aching             Up ad lib / ambulatory/ voiding QS  Newborn female breast feeding / Newborn son in NICU   / Circumcision planned eventually when stable  O:  VS: BP 98/59  Pulse 64  Temp(Src) 97.4 F (36.3 C) (Oral)  Resp 18  Ht 5\' 4"  (1.626 m)  Wt 81.194 kg (179 lb)  BMI 30.71 kg/m2  SpO2 97%   LABS:  Recent Labs  06/13/12 0650  WBC 9.6  HGB 11.5*  PLT 101*                           I&O: Intake/Output     05/03 0701 - 05/04 0700 05/04 0701 - 05/05 0700   P.O. 960    I.V. (mL/kg)     Total Intake(mL/kg) 960 (11.8)    Urine (mL/kg/hr) 650 (0.3)    Blood     Total Output 650     Net +310                       Physical Exam:             Alert and Oriented X3  Abdomen: soft, non-tender, mildly distended./active BS             Fundus: firm, non-tender, U-1             Dressing intact              Perineum: no edema  Lochia: light  Extremities: trace edema, no calf pain or tenderness  A:        POD # 2 S/P Cesarean section with bilateral salpingectomy for tubal sterilization / TWINS            Female newborn in NICU - possible pneumonia / persistent fluid on Xray             Increased pain today  P:        Routine postoperative care              Warm fluids and ambulation for bowel motility / use analgesia             Continue current management - consider additional day inpt if needed   Marlinda Mike CNM, MSN 06/14/2012, 10:11 AM

## 2012-06-15 ENCOUNTER — Encounter (HOSPITAL_COMMUNITY)
Admission: RE | Admit: 2012-06-15 | Discharge: 2012-06-15 | Disposition: A | Discharge status: Home / Self Care | Payer: PRIVATE HEALTH INSURANCE | Source: Ambulatory Visit | Attending: Obstetrics & Gynecology | Admitting: Obstetrics & Gynecology

## 2012-06-15 ENCOUNTER — Encounter (HOSPITAL_COMMUNITY): Payer: Self-pay | Admitting: Obstetrics & Gynecology

## 2012-06-15 DIAGNOSIS — O923 Agalactia: Secondary | ICD-10-CM | POA: Insufficient documentation

## 2012-06-15 MED ORDER — OXYCODONE-ACETAMINOPHEN 5-325 MG PO TABS: 1.0000 | ORAL_TABLET | ORAL | Status: AC | PRN

## 2012-06-15 MED ORDER — IBUPROFEN 600 MG PO TABS: 600.0000 mg | ORAL_TABLET | Freq: Four times a day (QID) | ORAL | Status: AC

## 2012-06-15 NOTE — Progress Notes (Signed)
POSTOPERATIVE DAY # 3 S/P CS TWINS with bilateral salpingectomy   S:         Reports feeling better today             Tolerating po intake / no nausea / no vomiting / + flatus / no BM             Bleeding is light             Pain controlled with motrin and percocet             Up ad lib / ambulatory/ voiding QS  Newborn breast feeding  / Circumcision planned when female stable (Female in NICU - plan 7 days of ABX)   O:  VS: BP 100/56  Pulse 60  Temp(Src) 98.6 F (37 C) (Axillary)  Resp 18  Ht 5\' 4"  (1.626 m)  Wt 81.194 kg (179 lb)  BMI 30.71 kg/m2  SpO2 97%   LABS:  Recent Labs  06/13/12 0650  WBC 9.6  HGB 11.5*  PLT 101*                       Physical Exam:             Alert and Oriented X3  Abdomen: soft, non-tender, non-distended              Fundus: firm, non-tender, U-1             Dressing intact              Incision:  approximated with subcuticular / no erythema / no ecchymosis / no drainage  Perineum: no edema  Lochia: light  Extremities: trace edema, no calf pain or tenderness  A:        POD # 3 S/P CS            Stable status  P:        Routine postoperative care              DC home             WOB booklet - instructions reveiwed     Marlinda Mike CNM, MSN 06/15/2012, 8:41 AM

## 2012-06-15 NOTE — Discharge Summary (Signed)
POSTOPERATIVE DISCHARGE SUMMARY:  Patient ID: [name] MRN: [id number] DOB/AGE: [birth year] 42 y.o.  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 GERD (gastroesophageal reflux disease)     Past surgical history:  Past Surgical History  Procedure Laterality Date  . Cesarean section    . Breast enhancement surgery    . Dilation and curettage of uterus    . Foreign body removal      foot    Family History: No family history on file.  Social History:  has no tobacco, alcohol, and drug history on file.  Allergies: Review of patient's allergies indicates no known allergies.   Current Medications at time of admission:  Prenatal vitamin Vitamin D 1000mg  daily  Zoloft 50 mg daily Pepcid 20 mg   Procedures: Cesarean section delivery of twin newborns by Dr Juliene Pina  See operative report for further details APGAR (1 MIN):    Dorlis, Judice Luceil [[id number]]  8   Miriah, Maruyama Maryama [[id number]]  9   APGAR (5 MINS):    Hulda, Reddix [[id number]]  9   Nakina, Spatz [[id number]]  65    Newborn Female to NICU - tachypnea initially with low O2 sat / persistent fluid in lungs per x-ray - treatment for pneumonia with IV abx  Postoperative / postpartum course: uneventful -  discharge POD 3  Physical Exam:   VSS: Temp:  [98.6 F (37 C)] 98.6 F (37 C) (05/05 0548) Pulse Rate:  [60] 60 (05/05 0548) Resp:  [18] 18 (05/05 0548) BP: (100)/(56) 100/56 mmHg (05/05 0548)  LABS:  Recent Labs  06/13/12 0650  WBC 9.6  HGB 11.5*  PLT 101*    General: pleasant / NAD Heart:RR Lungs:clear Abdomen: active BS / non-distended / uterus non-tender & firm Extremities: Trace edema Dressing: intact honeycomb Incision:  approximated with subcuticular with SteriStrips / no erythema / no ecchymosis / no drainage  Discharge Instructions:  Discharged Condition: stable Activity: pelvic rest and postoperative restrictions x 2 weeks Diet: routine Medications: see below   Medication List    TAKE these medications       cholecalciferol 1000 UNITS tablet  Commonly known as:  VITAMIN D  Take 4,000 Units by mouth daily.     famotidine 20 MG tablet  Commonly known as:  PEPCID  Take 20 mg by mouth 2 (two) times daily.     ibuprofen 600 MG tablet  Commonly known as:  ADVIL,MOTRIN  Take 1 tablet (600 mg total) by mouth every 6 (six) hours.     oxyCODONE-acetaminophen 5-325 MG per tablet  Commonly known as:  PERCOCET/ROXICET  Take 1-2 tablets by mouth every 4 (four) hours as needed.     prenatal multivitamin Tabs  Take 1 tablet by mouth 2 (two) times daily.  sertraline 50 MG tablet  Commonly known as:  ZOLOFT  Take 50 mg by mouth daily.       Wound Care: remove dressing Wednesday Postpartum Instructions: [facility] discharge booklet - instructions reviewed Discharge to: Home  Follow up : [facility] in 6 weeks for routine postpartum visit                      Signed: Marlinda Mike CNM, MSN 06/15/2012, 8:46 AM

## 2012-06-17 ENCOUNTER — Inpatient Hospital Stay (HOSPITAL_COMMUNITY): Admission: AD | Admit: 2012-06-17 | Payer: Self-pay | Source: Ambulatory Visit | Admitting: Obstetrics & Gynecology

## 2012-06-18 NOTE — Discharge Summary (Signed)
Reviewed and agree with note and plan. V.Tracie Dore, MD  

## 2012-07-16 ENCOUNTER — Encounter (HOSPITAL_COMMUNITY)
Admission: RE | Admit: 2012-07-16 | Discharge: 2012-07-16 | Disposition: A | Discharge status: Home / Self Care | Payer: PRIVATE HEALTH INSURANCE | Source: Ambulatory Visit | Attending: Obstetrics & Gynecology | Admitting: Obstetrics & Gynecology

## 2012-07-16 DIAGNOSIS — O923 Agalactia: Secondary | ICD-10-CM | POA: Insufficient documentation

## 2012-08-15 ENCOUNTER — Encounter (HOSPITAL_COMMUNITY)
Admission: RE | Admit: 2012-08-15 | Discharge: 2012-08-15 | Disposition: A | Discharge status: Home / Self Care | Payer: PRIVATE HEALTH INSURANCE | Source: Ambulatory Visit | Attending: Obstetrics & Gynecology | Admitting: Obstetrics & Gynecology

## 2012-08-15 DIAGNOSIS — O923 Agalactia: Secondary | ICD-10-CM | POA: Insufficient documentation

## 2013-12-13 ENCOUNTER — Encounter (HOSPITAL_COMMUNITY): Payer: Self-pay | Admitting: Obstetrics & Gynecology

## 2015-04-25 ENCOUNTER — Other Ambulatory Visit: Payer: Self-pay | Admitting: *Deleted

## 2015-04-25 DIAGNOSIS — I83813 Varicose veins of bilateral lower extremities with pain: Secondary | ICD-10-CM

## 2015-05-01 ENCOUNTER — Encounter: Payer: Self-pay | Admitting: Vascular Surgery

## 2015-05-05 ENCOUNTER — Encounter: Payer: Self-pay | Admitting: Vascular Surgery

## 2015-05-05 ENCOUNTER — Ambulatory Visit (HOSPITAL_COMMUNITY)
Admission: RE | Admit: 2015-05-05 | Discharge: 2015-05-05 | Disposition: A | Discharge status: Home / Self Care | Payer: 59 | Source: Ambulatory Visit | Attending: Vascular Surgery | Admitting: Vascular Surgery

## 2015-05-05 ENCOUNTER — Ambulatory Visit (INDEPENDENT_AMBULATORY_CARE_PROVIDER_SITE_OTHER): Payer: 59 | Admitting: Vascular Surgery

## 2015-05-05 VITALS — BP 99/71 | HR 66 | Ht 64.0 in | Wt 140.6 lb

## 2015-05-05 DIAGNOSIS — I872 Venous insufficiency (chronic) (peripheral): Secondary | ICD-10-CM | POA: Diagnosis not present

## 2015-05-05 DIAGNOSIS — I83813 Varicose veins of bilateral lower extremities with pain: Secondary | ICD-10-CM | POA: Insufficient documentation

## 2015-05-05 DIAGNOSIS — I83893 Varicose veins of bilateral lower extremities with other complications: Secondary | ICD-10-CM | POA: Diagnosis not present

## 2015-05-05 DIAGNOSIS — K219 Gastro-esophageal reflux disease without esophagitis: Secondary | ICD-10-CM | POA: Diagnosis not present

## 2015-05-05 DIAGNOSIS — I83899 Varicose veins of unspecified lower extremities with other complications: Secondary | ICD-10-CM | POA: Insufficient documentation

## 2015-05-05 NOTE — Progress Notes (Signed)
[facility] [place]  Patient name: [name] MRN: [id number] DOB: [birth year] Sex: female  REASON FOR VISIT: Varicose veins  HPI: [name] is a 45 y.o. female, who presents for evaluation of bilateral leg pain and varicosities. The patient reports progression of her varicosities since being pregnant with twins a few years ago. The patient reports intense itching over her varicosities resulting in development of bruising. She is also reported incidents of bleeding from superficial varicosities on her left shin. She also reports heaviness and pain in her legs bilaterally that is worse at the end of the day. She did wear compression stockings during her pregnancy. She has family history of varicose veins. She denies a prior history of DVT.  She has no significant past medical history.  Past Medical History  Diagnosis Date  . GERD (gastroesophageal reflux disease)     Family History: patient is unable to detail the medical history of his parents  SOCIAL HISTORY: Social History   Social History  . Marital Status: Married    Spouse Name: N/A  . Number of Children: N/A  . Years of Education: N/A   Occupational History  . Not on file.   Social History Main Topics  . Smoking status: Former Games developermoker  . Smokeless tobacco: Not on file  . Alcohol Use: No  . Drug Use: No  . Sexual Activity: Not on file   Other Topics Concern  . Not on file   Social History Narrative    No Known Allergies  Current Outpatient Prescriptions  Medication Sig Dispense Refill  . cholecalciferol (VITAMIN D) 1000 UNITS tablet Take 4,000 Units by mouth daily. Reported on 05/05/2015    . famotidine (PEPCID) 20 MG tablet Take 20 mg by mouth 2 (two) times daily. Reported on 05/05/2015    . ibuprofen (ADVIL,MOTRIN) 600 MG tablet Take 1 tablet (600 mg total) by mouth every 6 (six) hours. (Patient not taking: Reported on 05/05/2015) 30 tablet 0  . oxyCODONE-acetaminophen  (PERCOCET/ROXICET) 5-325 MG per tablet Take 1-2 tablets by mouth every 4 (four) hours as needed. (Patient not taking: Reported on 05/05/2015) 30 tablet 0  . Prenatal Vit-Fe Fumarate-FA (PRENATAL MULTIVITAMIN) TABS Take 1 tablet by mouth 2 (two) times daily. Reported on 05/05/2015    . sertraline (ZOLOFT) 50 MG tablet Take 50 mg by mouth daily. Reported on 05/05/2015     No current facility-administered medications for this visit.    REVIEW OF SYSTEMS:  [X]  denotes positive finding, [ ]  denotes negative finding Cardiac  Comments:  Chest pain or chest pressure:    Shortness of breath upon exertion:    Short of breath when lying flat:    Irregular heart rhythm:        Vascular    Pain in calf, thigh, or hip brought on by ambulation:    Pain in feet at night that wakes you up from your sleep:     Blood clot in your veins:    Leg swelling:         Pulmonary    Oxygen at home:    Productive cough:     Wheezing:         Neurologic    Sudden weakness in arms or legs:     Sudden numbness in arms or legs:     Sudden onset of difficulty speaking or slurred speech:    Temporary loss of vision in one eye:     Problems with dizziness:  Gastrointestinal    Blood in stool:     Vomited blood:         Genitourinary    Burning when urinating:     Blood in urine:        Psychiatric    Major depression:         Hematologic    Bleeding problems:    Problems with blood clotting too easily:        Skin    Rashes or ulcers:        Constitutional    Fever or chills:      PHYSICAL EXAM: Filed Vitals:   05/05/15 1538  BP: 99/71  Pulse: 66  Height:  (1.626 m)  Weight: 140 lb 9.6 oz (63.776 kg)  SpO2: 99%    GENERAL: The patient is a well-nourished female, in no acute distress. The vital signs are documented above. CARDIAC: There is a regular rate and rhythm. No carotid bruits VASCULAR: 2+ radial, 2+ posterior tibial pulses bilaterally. PULMONARY: There is good air  exchange bilaterally without wheezing or rales. MUSCULOSKELETAL: Large tortuous varicosities to right medial leg. Left leg with large varicosity to anterior shin and thigh. Multiple spider veins bilaterally. NEUROLOGIC: No focal weakness or paresthesias are detected. SKIN: There are no ulcers or rashes noted. PSYCHIATRIC: The patient has a normal affect. HEAD: [place]/AT EAR/NOSE/THROAT: Hearing grossly intact, nares without erythema or drainage, oropharynx without Erythema/Exudate, Mallampati score: 3 NECK: Supple, no nuchal rigidity, no palpable LAD LYMPH:  No Cervical, Axillary, or Inguinal lymphadenopathy    DATA:  Lower extremity venous reflux evaluation 05/05/2015  Right lower extremity: No evidence of DVT or superficial thrombophlebitis. Common femoral reflux present. Great saphenous vein reflux at mid thigh and knee. Great saphenous vein is dilated with diameters of 0.57 cm at the knee 2.76 at the saphenofemoral junction.  Left lower extremity: No evidence of DVT or superficial thrombophlebitis. No evidence of deep vein reflux. Reflux at mid thigh. Great saphenous vein is dilated with diameters of 0.51 cm at knee and 1.2 cm at the saphenofemoral junction.  MEDICAL ISSUES: Varicose veins with bleeding complications Chronic venous insufficiency  The patient has evidence of segmental great saphenous vein reflux bilaterally. She has dilated great saphenous veins bilaterally. She has had multiple bleeding complications from left leg varicosities. She also reports severe itching and pain associated with her varicosities. We'll recommend conservative treatment with 20-30 mmHg thigh-high compression stockings, elevation and ibuprofen. If she fails conservative treatment, we'll have the patient return for evaluation for laser ablation and stab phlebectomy his and possible sclerotherapy. She will follow-up in 3 months.   Maris Berger, PA-C [facility] [phone]    Addendum  I have independently interviewed and examined the patient, and I agree with the physician assistant's findings.  Pt has had hematoma with injury of right anterior shin varicosities.  Her B GSV have segment incompetency with enlarged size, R: 4.6-7.6 mm, L 4.0-12.0 mm.  She will complete a 3 month trial of compressive therapy and then follow up in 3 months for evaluation for EVLA L GSV followed by R GSV as needed.  Leonides Sake, MD [facility] [place] Office: [phone] Pager: [phone]  05/05/2015, 4:27 PM

## 2015-08-02 ENCOUNTER — Encounter: Payer: Self-pay | Admitting: Vascular Surgery

## 2015-08-08 ENCOUNTER — Encounter: Payer: Self-pay | Admitting: Vascular Surgery

## 2015-08-08 ENCOUNTER — Ambulatory Visit (INDEPENDENT_AMBULATORY_CARE_PROVIDER_SITE_OTHER): Payer: 59 | Admitting: Vascular Surgery

## 2015-08-08 VITALS — BP 96/65 | HR 63 | Temp 98.6°F | Resp 18 | Ht 64.75 in | Wt 136.4 lb

## 2015-08-08 DIAGNOSIS — I83893 Varicose veins of bilateral lower extremities with other complications: Secondary | ICD-10-CM | POA: Diagnosis not present

## 2015-08-08 NOTE — Progress Notes (Signed)
Problems with Activities of Daily Living Secondary to Leg Pain  1. Mrs. [name] states that she has difficulty caring for her young children due to leg pain.   2. Mrs. [name] states that all activities that require prolonged standing (cooking, cleaning , shopping) are difficult due to prolonged standing.       Failure of  Conservative Therapy:  1. Worn 20-30 mm Hg thigh high compression hose >3 months with no relief of symptoms.  2. Frequently elevates legs-no relief of symptoms  3. Taken Ibuprofen 600 Mg TID with no relief of symptoms.                                        [facility]  Patient name: [name] MRN: [id number] DOB: [birth year] Sex: female  REASON FOR VISIT: Follow-up bilateral painful venous varicosities  HPI: [name] is a 45 y.o. female ear today for follow-up. Saw Dr. Claudie Fishermanhin in March regarding the same issue. Formal duplex at that time revealed reflux in her great saphenous vein and no evidence of deep venous reflux. She did have dilated great saphenous vein bilaterally. She reports that she continues to have pain and itching over varicosities despite use of compression garments.  Past Medical History  Diagnosis Date  . GERD (gastroesophageal reflux disease)   . Varicose veins     History reviewed. No pertinent family history.  SOCIAL HISTORY: Social History  Substance Use Topics  . Smoking status: Former Smoker    Quit date: 02/12/1995  . Smokeless tobacco: Not on file  . Alcohol Use: No    No Known Allergies  Current Outpatient Prescriptions  Medication Sig Dispense Refill  . cholecalciferol (VITAMIN D) 1000 UNITS tablet Take 4,000 Units by mouth daily. Reported on 08/08/2015    . famotidine (PEPCID) 20 MG tablet Take 20 mg by mouth 2 (two) times daily. Reported on 08/08/2015    . ibuprofen (ADVIL,MOTRIN) 600 MG tablet Take 1 tablet (600 mg total) by mouth every 6 (six) hours. (Patient not taking: Reported on  08/08/2015) 30 tablet 0  . oxyCODONE-acetaminophen (PERCOCET/ROXICET) 5-325 MG per tablet Take 1-2 tablets by mouth every 4 (four) hours as needed. (Patient not taking: Reported on 08/08/2015) 30 tablet 0  . Prenatal Vit-Fe Fumarate-FA (PRENATAL MULTIVITAMIN) TABS Take 1 tablet by mouth 2 (two) times daily. Reported on 08/08/2015    . sertraline (ZOLOFT) 50 MG tablet Take 50 mg by mouth daily. Reported on 08/08/2015     No current facility-administered medications for this visit.    REVIEW OF SYSTEMS:  [X]  denotes positive finding, [ ]  denotes negative finding Cardiac  Comments:  Chest pain or chest pressure:    Shortness of breath upon exertion:    Short of breath when lying flat:    Irregular heart rhythm:        Vascular    Pain in calf, thigh, or hip brought on by ambulation:    Pain in feet at night that wakes you up from your sleep:     Blood clot in your veins:    Leg swelling:         Pulmonary    Oxygen at home:    Productive cough:     Wheezing:         Neurologic    Sudden weakness in arms or legs:     Sudden  numbness in arms or legs:     Sudden onset of difficulty speaking or slurred speech:    Temporary loss of vision in one eye:     Problems with dizziness:         Gastrointestinal    Blood in stool:     Vomited blood:         Genitourinary    Burning when urinating:     Blood in urine:        Psychiatric    Major depression:         Hematologic    Bleeding problems:    Problems with blood clotting too easily:        Skin    Rashes or ulcers:        Constitutional    Fever or chills:      PHYSICAL EXAM: Filed Vitals:   08/08/15 0911  BP: 96/65  Pulse: 63  Temp: 98.6 F (37 C)  TempSrc: Oral  Resp: 18  Height: 5' 4.75" (1.645 m)  Weight: 136 lb 6.4 oz (61.871 kg)  SpO2: 100%    GENERAL: The patient is a well-nourished female, in no acute distress. The vital signs are documented above. VASCULAR: 2+ dorsalis pedis pulses  bilaterally PULMONARY: There is good air exchange .Marland Kitchen.  MUSCULOSKELETAL: There are no major deformities or cyanosis. NEUROLOGIC: No focal weakness or paresthesias are detected. SKIN: There are no ulcers or rashes noted. PSYCHIATRIC: The patient has a normal affect. Patient does have a scattered tributary varicosities on both lower extremities. These began on her medial proximal thigh just medial to her groin on the right and extends throughout her thigh and calf. On the left these are more in her medial Calf and pretibial area  DATA:  I reviewed her formal duplex and also reimage her saphenous veins with SonoSite ultrasound. This does show enlarged saphenous vein bilaterally extending into these varicosities  MEDICAL ISSUES: Failed conservative therapy for a bilateral painful varicosities. Have recommended bilateral staged laser ablation of her great saphenous vein and stab phlebectomy of tributary varicosities for symptom relief. She understands and wishes to proceed as soon as possible.    Larina Earthlyodd F. Malek Skog, MD FACS [facility] Office Tel [phone] Pager [phone]
